# Patient Record
Sex: Male | Born: 1985 | Race: White | Hispanic: No | Marital: Single | Smoking: Former smoker
Health system: Southern US, Community
[De-identification: ages and names within clinical notes are randomized; demographics above are authoritative.]

---

## 2004-06-24 ENCOUNTER — Emergency Department (HOSPITAL_COMMUNITY): Admission: EM | Admit: 2004-06-24 | Discharge: 2004-06-24 | Payer: Self-pay | Admitting: Emergency Medicine

## 2004-07-18 ENCOUNTER — Encounter: Admission: RE | Admit: 2004-07-18 | Discharge: 2004-07-18 | Payer: Self-pay | Admitting: Gastroenterology

## 2004-10-21 ENCOUNTER — Encounter: Admission: RE | Admit: 2004-10-21 | Discharge: 2004-10-21 | Payer: Self-pay | Admitting: Otolaryngology

## 2005-01-22 ENCOUNTER — Ambulatory Visit (HOSPITAL_COMMUNITY): Admission: RE | Admit: 2005-01-22 | Discharge: 2005-01-22 | Payer: Self-pay | Admitting: Internal Medicine

## 2005-07-11 ENCOUNTER — Other Ambulatory Visit: Payer: Self-pay

## 2005-07-11 ENCOUNTER — Emergency Department: Payer: Self-pay | Admitting: Unknown Physician Specialty

## 2005-11-02 ENCOUNTER — Ambulatory Visit: Payer: Self-pay | Admitting: Internal Medicine

## 2005-11-13 ENCOUNTER — Ambulatory Visit: Payer: Self-pay | Admitting: Family Medicine

## 2007-02-18 ENCOUNTER — Ambulatory Visit: Payer: Self-pay | Admitting: Family Medicine

## 2007-04-06 ENCOUNTER — Emergency Department (HOSPITAL_COMMUNITY): Admission: EM | Admit: 2007-04-06 | Discharge: 2007-04-07 | Payer: Self-pay | Admitting: Emergency Medicine
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
## 2010-06-20 NOTE — Letter (Signed)
November 02, 2005     Marcina Millard, MD  [address]  [place], [place] [address]   RE:  OMERO, KOWAL  MRN:  [id number]  /  DOB:  December 03, 1985   Dear Trinna Post:   It was a pleasure seeing Trace Wirick today regarding his bradycardia that  occurred on the treadmill.   He is a 25 year old young man who has been working at [employer] for 6 years, who,  in the spring of this year, started developing a left-sided chest pain.  This was unrelated to exertion and seemed to get worse when he went to work,  was bad when he got off of work and has been associated with progressive  lethargy and lack of enthusiasm about life.  He is very anxious about this  and has felt like he needs to go by the hospital almost every day.   He ended going to the hospital in June, where laboratories were notable for  a normal hemogram and blood sugar of 133 and a normal troponin.  TSH was not  drawn.  Over the last 3 months, he has had a 20-pound weight loss.  He has  had some diaphoresis.  He says he has a good appetite, but is not eating  very much.  His chest pains are chronic, as noted, and are not aggravated by  work.  He works pushing very heavy boxes at [employer] and this clearly does not  aggravate his symptoms.   His past medical history is notable for 2 things; one is a longstanding  history of neurally mediated syncope.  He has had multiple body piercings  and tattoos and he has passed out with almost all of these.  He passes out  with some blood and the prodrome that he had here was similar to the  prodrome as noted just below.   The other thing in his past history is notable for GE reflux disease and  associated with lymphadenopathy.  He was on Nexium last year for a month or  so, but it cost $300 and he ended up having to stop.   What prompted this referral was that he was sent for a stress echo related  to his chest pain.  He underwent the echo.  While getting up to get on the  treadmill, he looked at the monitor and began to feel his typical prodrome.  He got on the treadmill and he had to stop on the treadmill.  We do not know  what happened exactly there, but within 30 seconds of recovery, his heart  rate was down to a nadir of 19, but in the 20s and 30s.  His symptoms  abated.  He started to get up and the symptoms recurred.  He laid back down  and ultimately the symptoms resolved.   In addition to the above, he is quite anxious about his chest pain now and  says he is getting depressed.  He lives with his girlfriend and this is now  going less well because of his difficulties related to his chest pain  syndrome.   Therapeutic interventions have included some antianxiety medication and  etodolac, neither of which have had a big impact.  He took a vacation in  July, which again had no impact.   In the interim, he has stopped smoking cigarettes and marijuana.  He is  using alcohol only occasionally and denies the use of recreational drugs.  He has no children.  His past surgical history is notable for a tonsillectomy.   His family history is noncontributory.   On examination, his blood pressure was 128/62, his pulse was 60 and his  weight was 144, down from what he says was 160.  His HEENT exam demonstrated  no icterus or xanthomata.  The neck veins were flat.  Carotids were brisk  and full bilaterally without bruits.  The back was without kyphosis or  scoliosis.  Lungs were clear.  Heart sounds were regular without murmurs or  gallops.  There was some left breast tenderness.  The abdomen was soft with  active bowel sounds, without midline pulsation.  There was mild epigastric  tenderness.  Femoral pulses were 2+.  Distal pulses were intact and there  was no clubbing, cyanosis, or edema.  The neurological exam was grossly  normal.  Skin was warm and dry.   Laboratories were reviewed and as we discussed.   IMPRESSION:  1. Neurally mediated syncope  and bradycardia.  2. Atypical chest pain with possible contributions of:      a.     Gastroesophageal reflux disease.      b.     Left breast tenderness.  3. Anxiety and depression contributing to ? cause, ? effect to the above.  4. A 20-pound weight loss.   Alex, as I mentioned, I do not think this is cardiac chest pain.  I think  his neurally mediated reflexes are a consistent explanation for his  recurrent syncope and the episode on the treadmill.   I have spoken with Dr. Burnett Sheng regarding the above other issues.  We will  plan to check a TSH today.  We have put him on Prilosec OTC and he will  follow up with Dr. Burnett Sheng.   I have suggested that there may also be a role for CT scanning for further  clarification that his heart is not involved as well as evaluation of his  left chest wall pain, ? left breast.  The weight loss issue will also be  addressed by Dr. Burnett Sheng.   Alex, thanks very much for asking [place] to see him.    Sincerely,      Duke Salvia, MD, [facility]     SCK/MedQ  DD:  11/02/2005  DT:  11/03/2005  Job #:  [id number]   CC:    Jerl Mina

## 2010-12-31 ENCOUNTER — Emergency Department (INDEPENDENT_AMBULATORY_CARE_PROVIDER_SITE_OTHER)
Admission: EM | Admit: 2010-12-31 | Discharge: 2010-12-31 | Disposition: A | Discharge status: Home / Self Care | Payer: 59 | Source: Home / Self Care | Attending: Emergency Medicine | Admitting: Emergency Medicine

## 2010-12-31 ENCOUNTER — Emergency Department (INDEPENDENT_AMBULATORY_CARE_PROVIDER_SITE_OTHER): Payer: 59

## 2010-12-31 DIAGNOSIS — J45909 Unspecified asthma, uncomplicated: Secondary | ICD-10-CM

## 2010-12-31 MED ORDER — PREDNISONE 20 MG PO TABS: 40.0000 mg | ORAL_TABLET | Freq: Every day | ORAL | Status: DC

## 2010-12-31 MED ORDER — PREDNISONE 20 MG PO TABS
ORAL_TABLET | ORAL | Status: AC
  Filled 2010-12-31: qty 3

## 2010-12-31 MED ORDER — ALBUTEROL SULFATE (5 MG/ML) 0.5% IN NEBU
INHALATION_SOLUTION | RESPIRATORY_TRACT | Status: AC
  Filled 2010-12-31: qty 0.5

## 2010-12-31 MED ORDER — AZITHROMYCIN 250 MG PO TABS: 250.0000 mg | ORAL_TABLET | Freq: Every day | ORAL | Status: AC

## 2010-12-31 MED ORDER — ALBUTEROL SULFATE (5 MG/ML) 0.5% IN NEBU
2.5000 mg | INHALATION_SOLUTION | RESPIRATORY_TRACT | Status: DC
  Administered 2010-12-31: 2.5 mg via RESPIRATORY_TRACT

## 2010-12-31 MED ORDER — PREDNISONE 20 MG PO TABS: 40.0000 mg | ORAL_TABLET | Freq: Every day | ORAL | Status: AC

## 2010-12-31 MED ORDER — PREDNISOLONE 5 MG PO TABS
60.0000 mg | ORAL_TABLET | Freq: Once | ORAL | Status: AC
  Administered 2010-12-31: 60 mg via ORAL

## 2010-12-31 MED ORDER — ALBUTEROL SULFATE HFA 108 (90 BASE) MCG/ACT IN AERS
1.0000 | INHALATION_SPRAY | Freq: Four times a day (QID) | RESPIRATORY_TRACT | Status: AC | PRN
Start: 2010-12-31 — End: 2011-12-31

## 2010-12-31 MED ORDER — AZITHROMYCIN 250 MG PO TABS: 250.0000 mg | ORAL_TABLET | Freq: Every day | ORAL | Status: DC

## 2010-12-31 MED ORDER — IPRATROPIUM BROMIDE 0.02 % IN SOLN
0.5000 mg | RESPIRATORY_TRACT | Status: DC
  Administered 2010-12-31: 0.5 mg via RESPIRATORY_TRACT

## 2010-12-31 NOTE — ED Provider Notes (Signed)
History     CSN: [id number] Arrival date & time: 12/31/2010  7:17 PM   First MD Initiated Contact with Patient 12/31/10 1814      Chief Complaint  Patient presents with  . Cough  . Shortness of Breath    (Consider location/radiation/quality/duration/timing/severity/associated sxs/prior treatment) Patient is a 25 y.o. male presenting with cough and shortness of breath. The history is provided by the patient.  Cough This is a new problem. The problem has been gradually worsening. The cough is productive of sputum. There has been no fever. Associated symptoms include rhinorrhea, sore throat, shortness of breath and wheezing. Pertinent negatives include no weight loss. He has tried decongestants for the symptoms. The treatment provided no relief. He is a smoker. Past medical history comments: Use to have croup as a child-.  Shortness of Breath  Associated symptoms include rhinorrhea, sore throat, cough, shortness of breath and wheezing. Past medical history comments: Use to have croup as a child-.    History reviewed. No pertinent past medical history.  History reviewed. No pertinent past surgical history.  No family history on file.  History  Substance Use Topics  . Smoking status: Current Everyday Smoker -- 0.2 packs/day  . Smokeless tobacco: Not on file  . Alcohol Use: Yes     occasional      Review of Systems  Constitutional: Negative for weight loss.  HENT: Positive for sore throat and rhinorrhea.   Respiratory: Positive for cough, shortness of breath and wheezing.     Allergies  Ritalin  Home Medications   Current Outpatient Rx  Name Route Sig Dispense Refill  . MUCINEX PO Oral Take by mouth as needed.        BP 138/80  Pulse 84  Temp(Src) 98.2 F (36.8 C) (Oral)  Resp 18  SpO2 92%  Physical Exam  Nursing note and vitals reviewed. Constitutional: He appears well-developed and well-nourished.  HENT:  Head: Normocephalic.  Eyes: Pupils are equal,  round, and reactive to light.  Neck: Normal range of motion.  Pulmonary/Chest: Effort normal. He has wheezes. He has rales.  Lymphadenopathy:    He has no cervical adenopathy.  Skin: Skin is warm.    ED Course  Procedures (including critical care time)  Labs Reviewed - No data to display No results found.   No diagnosis found.    MDM  Cough and dyspnea with wheezing x 3 weeks        Jimmie Molly, MD 12/31/10 [phone]

## 2010-12-31 NOTE — ED Notes (Signed)
States he feels better since breathing tx.  States he can breathe a lot better

## 2010-12-31 NOTE — ED Notes (Signed)
C/o productive cough of green-tan sputum, runny nose, chest hurts with coughing and deep breathing and feels SOB.  States sx worse at night

## 2011-06-19 ENCOUNTER — Encounter: Payer: Self-pay | Admitting: Medical

## 2011-06-19 ENCOUNTER — Ambulatory Visit (INDEPENDENT_AMBULATORY_CARE_PROVIDER_SITE_OTHER): Payer: 59 | Admitting: Medical

## 2011-06-19 VITALS — BP 120/80 | HR 60 | Temp 98.3°F | Resp 16 | Ht 68.0 in | Wt 164.0 lb

## 2011-06-19 DIAGNOSIS — F419 Anxiety disorder, unspecified: Secondary | ICD-10-CM

## 2011-06-19 DIAGNOSIS — Z720 Tobacco use: Secondary | ICD-10-CM

## 2011-06-19 DIAGNOSIS — R030 Elevated blood-pressure reading, without diagnosis of hypertension: Secondary | ICD-10-CM

## 2011-06-19 DIAGNOSIS — F411 Generalized anxiety disorder: Secondary | ICD-10-CM

## 2011-06-19 DIAGNOSIS — F17200 Nicotine dependence, unspecified, uncomplicated: Secondary | ICD-10-CM

## 2011-06-19 MED ORDER — SERTRALINE HCL 25 MG PO TABS: ORAL_TABLET | ORAL | Status: DC

## 2011-06-19 NOTE — Patient Instructions (Signed)

## 2011-06-19 NOTE — Progress Notes (Signed)
Subjective: Here for concern of elevated BP.  He is a new patient today.  He notes that he has a BP cuff at home and recently seeing DBP in the 150s at times.   He notes being under lots of stress. 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  He is also a full time Consulting civil engineer, doing online classes for Dance movement psychotherapist.  Has 1 year left of school.  He lately has been having panic attacks daily.  Gets anxious then gets SOB, palpitations, fingers tingling, and when he has had these episodes, he has checked BP and it has been elevated.   He notes being on Zoloft years ago, tolerated this well.  He is adopted.  No known family history known. He was smoking, cut this and salt out of diet.  He does use chewing tobacco though.       No other c/o.  The following portions of the patient's history were reviewed and updated as appropriate: allergies, current medications, past family history, past medical history, past social history, past surgical history and problem list.  No past medical history on file.  Allergies  Allergen Reactions  . Ritalin (Methylphenidate Hcl)     Review of Systems ROS reviewed and was negative other than noted in HPI or above.    Objective:   Physical Exam  General appearance: alert, no distress, WD/WN Oral cavity: MMM, no lesions Neck: supple, no lymphadenopathy, no thyromegaly, no masses Heart: RRR, normal S1, S2, no murmurs Lungs: CTA bilaterally, no wheezes, rhonchi, or rales Abdomen: +bs, soft, non tender, non distended, no masses, no hepatomegaly, no splenomegaly Pulses: 2+ symmetric, upper and lower extremities, normal cap refill   Assessment and Plan :     Encounter Diagnoses  Name Primary?  Marland Kitchen Anxiety Yes  . Elevated blood pressure reading without diagnosis of hypertension   . Smokeless tobacco use    Anxiety - Discussed his concerns.  Discussed strategies to deal with anxiety and panic  attacks.  Recommended counseling.  Begin back on Zoloft, discussed risks/benefits of medication. Recheck 2-3 wk.   Elevated BP - keep BP log, recheck 2wk.  Smokeless tobacco - advised he stop tobacco completley.

## 2011-07-17 ENCOUNTER — Encounter: Payer: Self-pay | Admitting: Medical

## 2011-07-17 ENCOUNTER — Ambulatory Visit: Payer: 59 | Admitting: Medical

## 2011-07-17 ENCOUNTER — Ambulatory Visit (INDEPENDENT_AMBULATORY_CARE_PROVIDER_SITE_OTHER): Payer: 59 | Admitting: Medical

## 2011-07-17 VITALS — BP 130/80 | HR 60 | Resp 16 | Wt 162.0 lb

## 2011-07-17 DIAGNOSIS — R030 Elevated blood-pressure reading, without diagnosis of hypertension: Secondary | ICD-10-CM

## 2011-07-17 DIAGNOSIS — F411 Generalized anxiety disorder: Secondary | ICD-10-CM

## 2011-07-17 DIAGNOSIS — F419 Anxiety disorder, unspecified: Secondary | ICD-10-CM

## 2011-07-17 NOTE — Progress Notes (Signed)
Subjective: Here for recheck.  I saw him almost a month ago for anxiety and elevated BP without hx/o HTN.    Since last visit he has begin back on Zoloft and this seems to be helping.  He feels less anxious, seems to be handling stress better. He switched one of his jobs since last visit, working [employer] on 3rd shift which accomodates his schedule better.  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  He has 2 days off per week now instead of 1 which makes things better.  He overall feels improvement after we talked last.   He has been checking his BP some and has gotten readings of SBP ranging 124-150, DBP 78-92.  No other c/o.  He did not bring his cuff today.  The following portions of the patient's history were reviewed and updated as appropriate: allergies, current medications, past family history, past medical history, past social history, past surgical history and problem list.  No past medical history on file.  Allergies  Allergen Reactions  . Ritalin (Methylphenidate Hcl)     Review of Systems ROS reviewed and was negative other than noted in HPI or above.    Objective:   Physical Exam  General appearance: alert, no distress, WD/WN Oral cavity: MMM, no lesions Neck: supple, no lymphadenopathy, no thyromegaly, no masses Heart: RRR, normal S1, S2, no murmurs Lungs: CTA bilaterally, no wheezes, rhonchi, or rales Pulses: 2+ symmetric  Assessment and Plan :     Encounter Diagnoses  Name Primary?  Marland Kitchen Anxiety Yes  . Elevated blood pressure reading without diagnosis of hypertension    Anxiety - improving.  Doing well with Zoloft, discussed counseling, discussed his stressors, and recent life changes with his job requirements.,    C/t Zoloft, recheck in 3-4 wk if not improving, otherwise recheck 2-52mo.  Elevated BP - recheck here with nurse in 84mo, bring his own meter and compare to ours.

## 2011-10-16 ENCOUNTER — Other Ambulatory Visit: Payer: Self-pay | Admitting: Medical

## 2011-10-16 NOTE — Telephone Encounter (Signed)
RX refill

## 2014-06-05 ENCOUNTER — Encounter (HOSPITAL_COMMUNITY): Payer: Self-pay | Admitting: Emergency Medicine

## 2014-06-05 ENCOUNTER — Emergency Department (HOSPITAL_COMMUNITY)
Admission: EM | Admit: 2014-06-05 | Discharge: 2014-06-05 | Disposition: A | Discharge status: Home / Self Care | Payer: BLUE CROSS/BLUE SHIELD | Source: Home / Self Care | Attending: Family Medicine | Admitting: Family Medicine

## 2014-06-05 DIAGNOSIS — R221 Localized swelling, mass and lump, neck: Secondary | ICD-10-CM

## 2014-06-05 NOTE — Discharge Instructions (Signed)
For problem swallowing, holding secretions, breathing, choking or worsening in any way To the emergency department. Otherwise called the number on the first page of the on-call ENT for appointment.

## 2014-06-05 NOTE — ED Provider Notes (Signed)
CSN: [id number]     Arrival date & time 06/05/14  1927 History   First MD Initiated Contact with Patient 06/05/14 2027     Chief Complaint  Patient presents with  . Dysphagia   (Consider location/radiation/quality/duration/timing/severity/associated sxs/prior Treatment) HPI Comments: 29 year old male who considers himself to be in generally good health is complaining of a sensation of a lump in the middle of his throat just behind the cricoid cartilage. This sensation started about 2 and half weeks ago. He only feels it when he swallowing. It is not disaffected swallowing. It is not painful. He does not have any problems breathing. Denies stridor or other abnormal airway sounds. He is able to handle his secretions well. He is able to drink and eat normally. He also states that he has been having some reflux recently.   History reviewed. No pertinent past medical history. History reviewed. No pertinent past surgical history. History reviewed. No pertinent family history. History  Substance Use Topics  . Smoking status: Former Smoker -- 0.25 packs/day  . Smokeless tobacco: Not on file  . Alcohol Use: Yes     Comment: occasional    Review of Systems  Constitutional: Negative.   HENT: Positive for postnasal drip. Negative for congestion, ear pain, rhinorrhea, sinus pressure, sore throat, tinnitus, trouble swallowing and voice change.        As per history of present illness  Respiratory: Negative.  Negative for cough, choking, shortness of breath, wheezing and stridor.   Cardiovascular: Negative.   Endocrine: Negative for polydipsia, polyphagia and polyuria.  All other systems reviewed and are negative.   Allergies  Ritalin  Home Medications   Prior to Admission medications   Medication Sig Start Date End Date Taking? Authorizing Provider  albuterol (PROVENTIL HFA;VENTOLIN HFA) 108 (90 BASE) MCG/ACT inhaler Inhale 1-2 puffs into the lungs every 6 (six) hours as needed for wheezing.  12/31/10 12/31/11  Jimmie Molly, MD  albuterol (PROVENTIL HFA;VENTOLIN HFA) 108 (90 BASE) MCG/ACT inhaler Inhale 1-2 puffs into the lungs every 6 (six) hours as needed for wheezing. 12/31/10 12/31/11  Jimmie Molly, MD  sertraline (ZOLOFT) 25 MG tablet 1 tablet po qhs 10/16/11   Kermit Balo Tysinger, PA-C   BP 158/107 mmHg  Pulse 81  Temp(Src) 99.3 F (37.4 C) (Oral)  Resp 16  SpO2 95% Physical Exam  Constitutional: He is oriented to person, place, and time. He appears well-developed and well-nourished. No distress.  HENT:  Mouth/Throat: Oropharynx is clear and moist. No oropharyngeal exudate.  No intraoral structure edema or swelling. Soft palate rises symmetrically. There is evidence of clear PND as well as cobblestoning in the posterior pharynx. No foreign body seen.  Eyes: Conjunctivae and EOM are normal.  Neck: Normal range of motion. Neck supple. No JVD present. No tracheal deviation present. No thyromegaly present.  Anterior neck exam is normal. Thyroid is of normal size and shape. It rises with swallowing in there are no palpable or observable nodules. No asymmetry. No overlying fullness. No surrounding adenopathy. Swallowing reflex is normal.  Cardiovascular: Normal rate, regular rhythm, normal heart sounds and intact distal pulses.   Pulmonary/Chest: Effort normal and breath sounds normal. No stridor. No respiratory distress. He has no wheezes. He has no rales.  Lymphadenopathy:    He has no cervical adenopathy.  Neurological: He is alert and oriented to person, place, and time.  Skin: Skin is warm and dry.  Psychiatric: He has a normal mood and affect.  Nursing note and vitals reviewed.  ED Course  Procedures (including critical care time) Labs Review Labs Reviewed - No data to display  Imaging Review No results found.   MDM   1. Sensation of lump in throat    For worsening, problems swallowing, breathing, choking, coughing or other problems go directly to the emergency  department. Otherwise call the ENT as listed on the first page and make an appointment. Will likely need to see ENT and have NP scoping. Possibly neck CT.    Hayden Rasmussenavid Nolan Tuazon, NP 06/05/14 2121

## 2014-06-05 NOTE — ED Notes (Signed)
Reports "I feel like I have something stuck in my throat".   Denies pain with swallowing.  No fever, n/v/d.   States that he dips about 1 to 2 cans a week.

## 2014-07-10 ENCOUNTER — Encounter (HOSPITAL_COMMUNITY): Payer: Self-pay | Admitting: Emergency Medicine

## 2014-07-10 ENCOUNTER — Emergency Department (HOSPITAL_COMMUNITY)
Admission: EM | Admit: 2014-07-10 | Discharge: 2014-07-10 | Disposition: A | Discharge status: Home / Self Care | Payer: BLUE CROSS/BLUE SHIELD | Source: Home / Self Care

## 2014-07-10 DIAGNOSIS — R197 Diarrhea, unspecified: Secondary | ICD-10-CM | POA: Diagnosis not present

## 2014-07-10 MED ORDER — CIPROFLOXACIN HCL 500 MG PO TABS: 500.0000 mg | ORAL_TABLET | Freq: Two times a day (BID) | ORAL | Status: AC

## 2014-07-10 NOTE — Discharge Instructions (Signed)
He likely contracted Salmonella or Escherichia coli. Please start antibiotics as prescribed. Please continue your probiotic and consider adding a daily yogurt with live active cultures. We will call you if you need additional medicines after reviewing your lab results.

## 2014-07-10 NOTE — ED Provider Notes (Signed)
CSN: [id number]     Arrival date & time 07/10/14  1931 History   None    Chief Complaint  Patient presents with  . Diarrhea   (Consider location/radiation/quality/duration/timing/severity/associated sxs/prior Treatment) HPI  Diarrhea: Started 4 days ago. Started the day after eating raw seafood and [place]. Patient has been drinking a lot of Pedialyte and grade to stay hydrated. Multiple bouts of watery loose diarrhea per day. Associated with crampy abdominal pain. Denies blood in the stool. Improved over the first 3 days but then worsened again today with worsening pain in the belly. He has had 2-3 episodes of diarrhea today. Denies nausea, vomiting, fevers, chest pain, shortness breath, palpitations.  History reviewed. No pertinent past medical history. History reviewed. No pertinent past surgical history. History reviewed. No pertinent family history. History  Substance Use Topics  . Smoking status: Former Smoker -- 0.25 packs/day  . Smokeless tobacco: Not on file  . Alcohol Use: Yes     Comment: occasional    Review of Systems Per HPI with all other pertinent systems negative.   Allergies  Ritalin  Home Medications   Prior to Admission medications   Medication Sig Start Date End Date Taking? Authorizing Provider  albuterol (PROVENTIL HFA;VENTOLIN HFA) 108 (90 BASE) MCG/ACT inhaler Inhale 1-2 puffs into the lungs every 6 (six) hours as needed for wheezing. 12/31/10 12/31/11  Jimmie MollyPaolo Coll, MD  albuterol (PROVENTIL HFA;VENTOLIN HFA) 108 (90 BASE) MCG/ACT inhaler Inhale 1-2 puffs into the lungs every 6 (six) hours as needed for wheezing. 12/31/10 12/31/11  Jimmie MollyPaolo Coll, MD  ciprofloxacin (CIPRO) 500 MG tablet Take 1 tablet (500 mg total) by mouth 2 (two) times daily. 07/10/14   Ozella Rocksavid J Ronelle Michie, MD  sertraline (ZOLOFT) 25 MG tablet 1 tablet po qhs 10/16/11   Kermit Baloavid S Tysinger, PA-C   BP 157/107 mmHg  Pulse 92  Temp(Src) 99 F (37.2 C) (Oral)  Resp 14  SpO2 95% Physical  Exam Physical Exam  Constitutional: oriented to person, place, and time. appears well-developed and well-nourished. No distress.  HENT:  Head: Normocephalic and atraumatic.  Eyes: EOMI. PERRL.  Neck: Normal range of motion.  Cardiovascular: RRR, no m/r/g, 2+ distal pulses,  Pulmonary/Chest: Effort normal and breath sounds normal. No respiratory distress.  Abdominal: Soft. Bowel sounds are normal. NonTTP, no distension.  Musculoskeletal: Normal range of motion. Non ttp, no effusion.  Neurological: alert and oriented to person, place, and time.  Skin: Skin is warm. No rash noted. non diaphoretic.  Psychiatric: normal mood and affect. behavior is normal. Judgment and thought content normal.   ED Course  Procedures (including critical care time) Labs Review Labs Reviewed  STOOL CULTURE    Imaging Review No results found.   MDM   1. Diarrhea    Suspect Salmonella or other bacterial infection. Stool culture and ova and parasite sent. Start ciprofloxacin. Continue probiotics and daily yogurt with live active cultures.    Ozella Rocksavid J Octivia Canion, MD 07/10/14 2026

## 2014-07-10 NOTE — ED Notes (Signed)
Reports having diarrhea since Friday.  No relief with otc meds.  No fever or vomiting.

## 2014-07-11 LAB — OVA AND PARASITE EXAMINATION

## 2014-07-13 LAB — STOOL CULTURE: SPECIAL REQUESTS: NORMAL

## 2014-07-13 NOTE — ED Notes (Signed)
Spoke w patient to advise of negative findings for stool testing. States he is doing well, no further problems since a "bug " came out of his intestines

## 2016-06-07 ENCOUNTER — Emergency Department (HOSPITAL_BASED_OUTPATIENT_CLINIC_OR_DEPARTMENT_OTHER): Payer: BLUE CROSS/BLUE SHIELD

## 2016-06-07 ENCOUNTER — Emergency Department (HOSPITAL_BASED_OUTPATIENT_CLINIC_OR_DEPARTMENT_OTHER)
Admission: EM | Admit: 2016-06-07 | Discharge: 2016-06-07 | Disposition: A | Discharge status: Home / Self Care | Payer: BLUE CROSS/BLUE SHIELD | Attending: Emergency Medicine | Admitting: Emergency Medicine

## 2016-06-07 ENCOUNTER — Encounter (HOSPITAL_BASED_OUTPATIENT_CLINIC_OR_DEPARTMENT_OTHER): Payer: Self-pay | Admitting: Emergency Medicine

## 2016-06-07 DIAGNOSIS — Y999 Unspecified external cause status: Secondary | ICD-10-CM | POA: Diagnosis not present

## 2016-06-07 DIAGNOSIS — Y929 Unspecified place or not applicable: Secondary | ICD-10-CM | POA: Insufficient documentation

## 2016-06-07 DIAGNOSIS — Y9339 Activity, other involving climbing, rappelling and jumping off: Secondary | ICD-10-CM | POA: Diagnosis not present

## 2016-06-07 DIAGNOSIS — Z87891 Personal history of nicotine dependence: Secondary | ICD-10-CM | POA: Insufficient documentation

## 2016-06-07 DIAGNOSIS — S93402A Sprain of unspecified ligament of left ankle, initial encounter: Secondary | ICD-10-CM

## 2016-06-07 DIAGNOSIS — S99912A Unspecified injury of left ankle, initial encounter: Secondary | ICD-10-CM | POA: Diagnosis present

## 2016-06-07 DIAGNOSIS — X501XXA Overexertion from prolonged static or awkward postures, initial encounter: Secondary | ICD-10-CM | POA: Diagnosis not present

## 2016-06-07 NOTE — ED Notes (Signed)
Pt offered a wheelchair, pt states he will walk out on crutches. Pt informed that if he changes his mind to let us know.

## 2016-06-07 NOTE — ED Provider Notes (Signed)
[facility] Note   CSN: [id number] Arrival date & time: 06/07/16  1301  By signing my name below, I, Rosario Adie, attest that this documentation has been prepared under the direction and in the presence of Jaeger Trueheart, Ambrose Finland, MD. Electronically Signed: Rosario Adie, ED Scribe. 06/07/16. 3:53 PM.  History   Chief Complaint Chief Complaint  Patient presents with  . Ankle Pain   The history is provided by the patient. No language interpreter was used.    HPI Comments: Jonathan Hull is a 31 y.o. male with no pertinent PMHx, who presents to the Emergency Department complaining of sudden onset, persistent left lateral ankle pain s/p injury which occurred past night. Per pt, he jumped a fence that was ~37ft high last night and upon landing he rolled his left ankle. He was able to ambulate on the extremity following; however, this morning after waking up his pain had acute worsened with weight bearing. His pain is exacerbated with weight bearing and alleviated with rest. No h/o injury to the ankle. He denies numbness, weakness, or any other associated symptoms.   History reviewed. No pertinent past medical history.  There are no active problems to display for this patient.  History reviewed. No pertinent surgical history.  Home Medications    Prior to Admission medications   Medication Sig Start Date End Date Taking? Authorizing Provider  albuterol (PROVENTIL HFA;VENTOLIN HFA) 108 (90 BASE) MCG/ACT inhaler Inhale 1-2 puffs into the lungs every 6 (six) hours as needed for wheezing. 12/31/10 12/31/11  Jimmie Molly, MD  albuterol (PROVENTIL HFA;VENTOLIN HFA) 108 (90 BASE) MCG/ACT inhaler Inhale 1-2 puffs into the lungs every 6 (six) hours as needed for wheezing. 12/31/10 12/31/11  Jimmie Molly, MD  ciprofloxacin (CIPRO) 500 MG tablet Take 1 tablet (500 mg total) by mouth 2 (two) times daily. 07/10/14   Ozella Rocks, MD  sertraline (ZOLOFT) 25 MG tablet 1  tablet po qhs 10/16/11   Tysinger, Kermit Balo, PA-C   Family History History reviewed. No pertinent family history.  Social History Social History  Substance Use Topics  . Smoking status: Former Smoker    Packs/day: 0.25  . Smokeless tobacco: Never Used  . Alcohol use Yes     Comment: occasional   Allergies   Ritalin [methylphenidate hcl]  Review of Systems Review of Systems A complete review of systems was obtained and all systems are negative except as noted in the HPI and PMH.   Physical Exam Updated Vital Signs BP (!) 135/91 (BP Location: Left Arm)   Pulse 88   Temp 98.6 F (37 C) (Oral)   Resp 20   Ht 5\' 9"  (1.753 m)   Wt 170 lb (77.1 kg)   SpO2 98%   BMI 25.10 kg/m   Physical Exam  Constitutional: He is oriented to person, place, and time. He appears well-developed and well-nourished. No distress.  HENT:  Head: Normocephalic and atraumatic.  Musculoskeletal: He exhibits tenderness.  Achilles tendon intact, no proximal fibular tenderness, no base of 5th metatarsal tenderness, no midfoot instability. Tenderness of anterior lateral malleolus with mild swelling. No medial malleolus tenderness.   Neurological: He is alert and oriented to person, place, and time.  Normal sensation b/l lower extremities  Skin: Skin is warm and dry. No erythema.  Psychiatric: He has a normal mood and affect. Judgment normal.  Nursing note and vitals reviewed.  ED Treatments / Results  DIAGNOSTIC STUDIES: Oxygen Saturation is 98% on RA, normal  by my interpretation.   COORDINATION OF CARE: 3:53 PM-Discussed next steps with pt. Pt verbalized understanding and is agreeable with the plan.   Radiology Dg Ankle Complete Left  Result Date: 06/07/2016 CLINICAL DATA:  Left ankle pain and swelling after injury yesterday. Initial encounter. EXAM: LEFT ANKLE COMPLETE - 3+ VIEW COMPARISON:  None. FINDINGS: Ossific densities below the medial and lateral malleoli appear corticated and chronic. No  acute fracture noted. No malalignment. Small heel spurs. IMPRESSION: No acute finding. Electronically Signed   By: Marnee SpringJonathon  Watts M.D.   On: 06/07/2016 14:45   Procedures Procedures   Medications Ordered in ED Medications - No data to display  Initial Impression / Assessment and Plan / ED Course  I have reviewed the triage vital signs and the nursing notes.  Pertinent labs & imaging results that were available during my care of the patient were reviewed by me and considered in my medical decision making (see chart for details).     This is a 31yo male who presents into the ED following rolling his ankle last night after jumping a small fence. Patient XR negative for obvious fracture, dislocation, or other bony abnormalities. Pt advised to follow up with orthopedics if symptoms persist for possibility of missed fracture diagnosis. Patient given brace and crutches while in ED, conservative therapy recommended and discussed. Patient will be d/c home. Pt is comfortable with above plan and is stable for discharge at this time. All questions were answered prior to disposition.   Final Clinical Impressions(s) / ED Diagnoses   Final diagnoses:  Sprain of left ankle, unspecified ligament, initial encounter   New Prescriptions New Prescriptions   No medications on file  I personally performed the services described in this documentation, which was scribed in my presence. The recorded information has been reviewed and is accurate.    Tomasz Steeves, Ambrose Finlandachel Morgan, MD 06/07/16 [phone]

## 2016-06-07 NOTE — ED Triage Notes (Signed)
Pt c/o LT ankle pain s/p jumping over 216ft fence last pm

## 2018-05-04 IMAGING — CR DG ANKLE COMPLETE 3+V*L*
3 series · 3 of 3 positions shown · non-contrast
Comparison: None.

CLINICAL DATA: Left ankle pain and swelling after injury yesterday.
Initial encounter.

EXAM:
LEFT ANKLE COMPLETE - 3+ VIEW

[t ankle joint ap left]
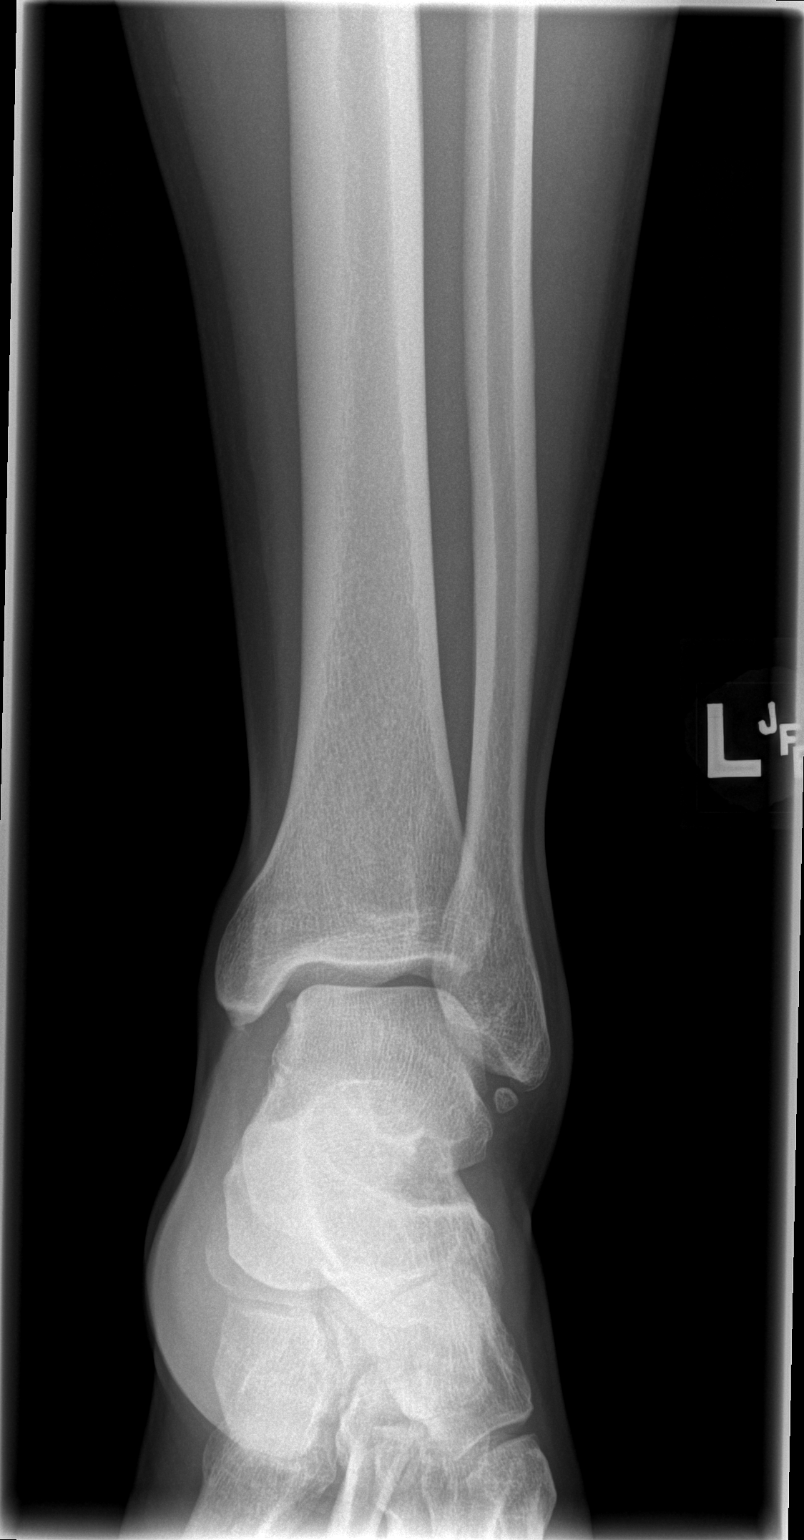

[t ankle joint oblique left]
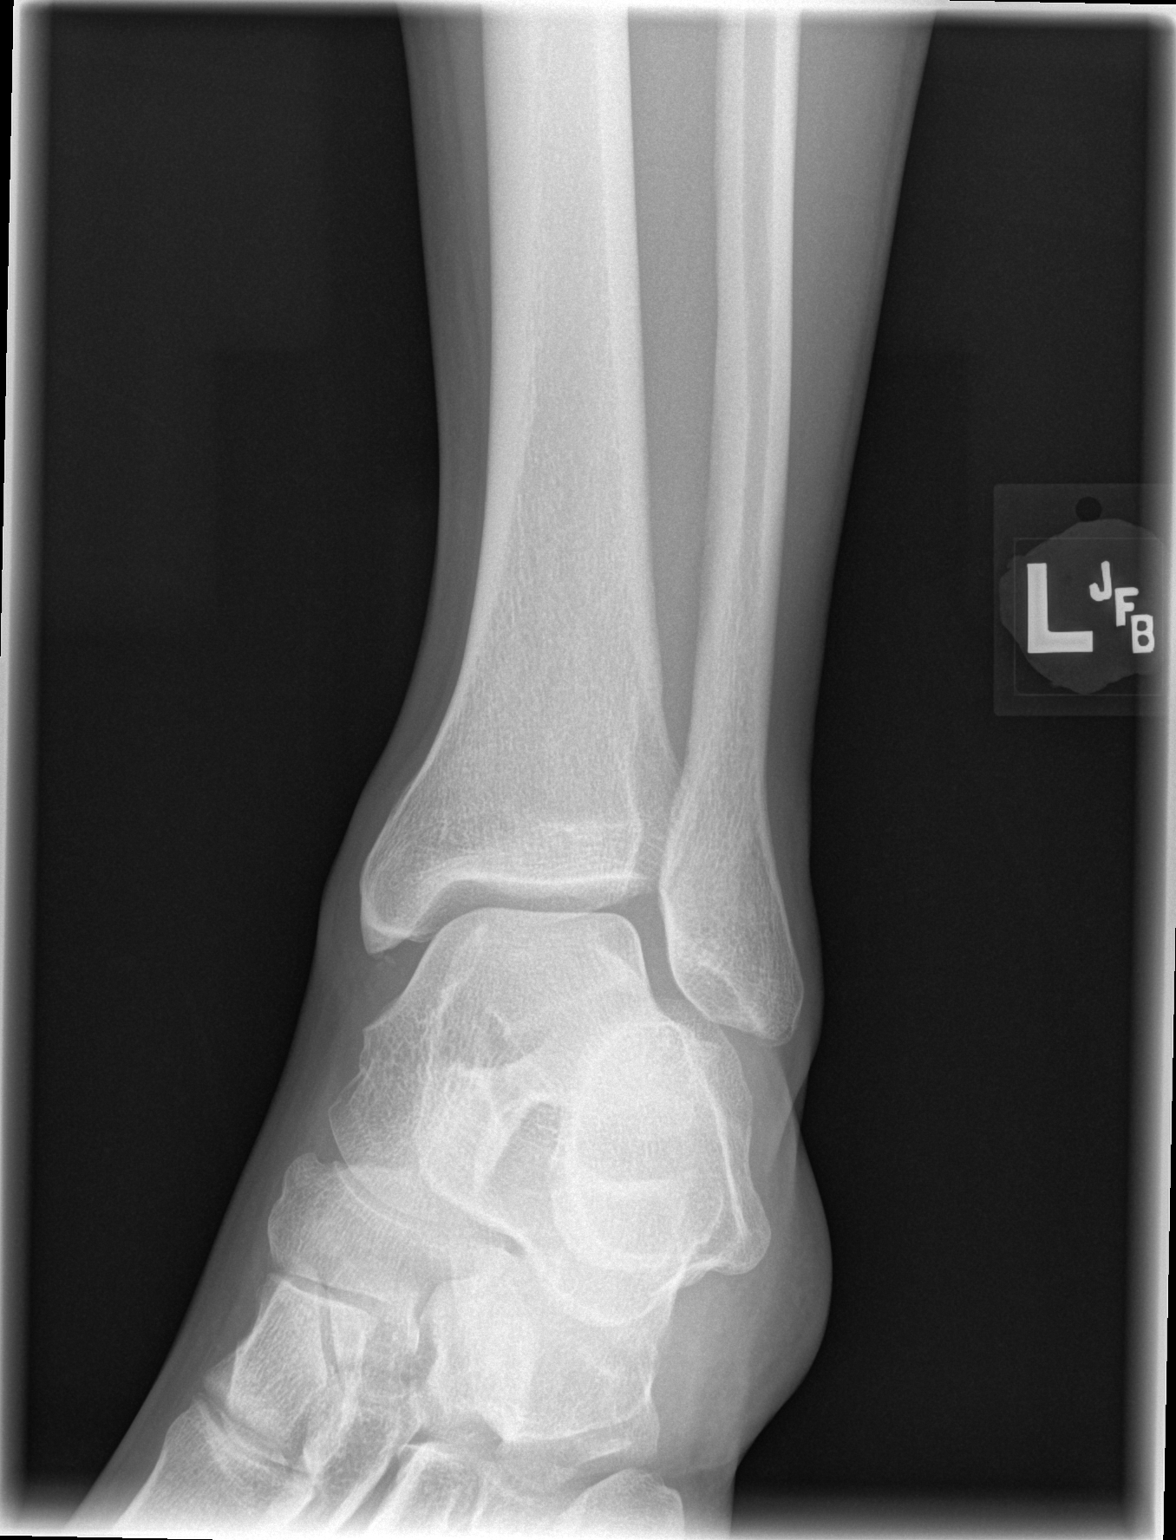

[t ankle joint lat left]
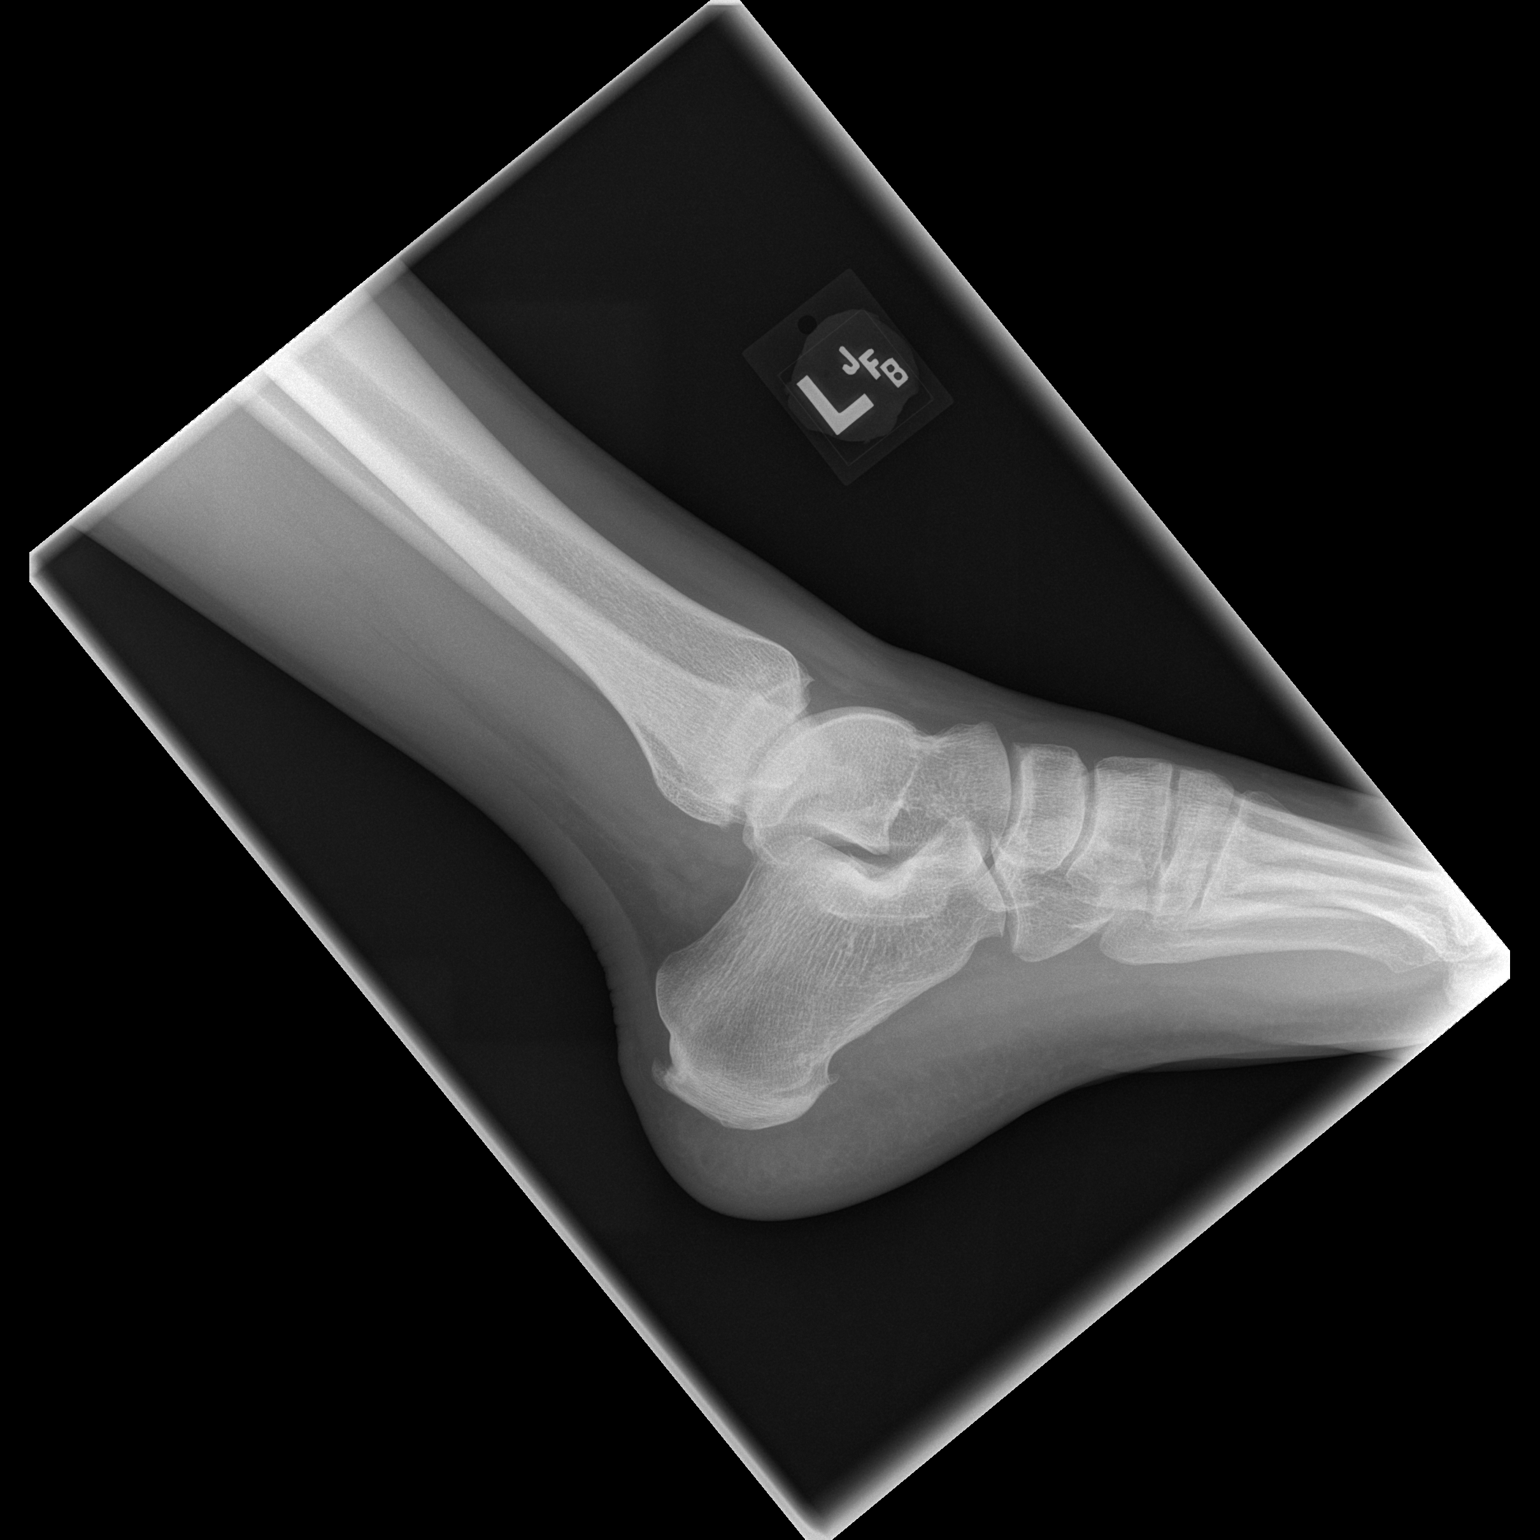

[3 of 3 positions shown; findings below may reference images not displayed]

FINDINGS: Ossific densities below the medial and lateral malleoli appear
corticated and chronic. No acute fracture noted. No malalignment.
Small heel spurs.
IMPRESSION: No acute finding.

## 2020-07-02 ENCOUNTER — Ambulatory Visit: Payer: Self-pay

## 2020-07-02 ENCOUNTER — Other Ambulatory Visit: Payer: Self-pay | Admitting: Family Medicine

## 2020-07-02 ENCOUNTER — Other Ambulatory Visit: Payer: Self-pay

## 2020-07-02 DIAGNOSIS — M25571 Pain in right ankle and joints of right foot: Secondary | ICD-10-CM

## 2023-07-08 ENCOUNTER — Ambulatory Visit
Admission: RE | Admit: 2023-07-08 | Discharge: 2023-07-08 | Disposition: A | Discharge status: Home / Self Care | Payer: Worker's Compensation | Source: Ambulatory Visit | Attending: Family Medicine | Admitting: Family Medicine

## 2023-07-08 ENCOUNTER — Other Ambulatory Visit: Payer: Self-pay | Admitting: Family Medicine

## 2023-07-08 DIAGNOSIS — M79642 Pain in left hand: Secondary | ICD-10-CM
